# Patient Record
Sex: Female | Born: 1980 | Race: Black or African American | Hispanic: No | Marital: Married | Smoking: Never smoker
Health system: Southern US, Community
[De-identification: ages and names within clinical notes are randomized; demographics above are authoritative.]

## PROBLEM LIST (undated history)

## (undated) DIAGNOSIS — D649 Anemia, unspecified: Secondary | ICD-10-CM

## (undated) HISTORY — PX: ANKLE SURGERY: SHX546

---

## 2016-12-03 ENCOUNTER — Encounter (HOSPITAL_COMMUNITY): Payer: Self-pay | Admitting: Emergency Medicine

## 2016-12-03 ENCOUNTER — Emergency Department (HOSPITAL_COMMUNITY)
Admission: EM | Admit: 2016-12-03 | Discharge: 2016-12-03 | Disposition: A | Discharge status: Home / Self Care | Payer: 59 | Attending: Emergency Medicine | Admitting: Emergency Medicine

## 2016-12-03 ENCOUNTER — Emergency Department (HOSPITAL_COMMUNITY): Payer: 59

## 2016-12-03 DIAGNOSIS — S39012A Strain of muscle, fascia and tendon of lower back, initial encounter: Secondary | ICD-10-CM

## 2016-12-03 DIAGNOSIS — M545 Low back pain, unspecified: Secondary | ICD-10-CM

## 2016-12-03 LAB — POC URINE PREG, ED: PREG TEST UR: NEGATIVE

## 2016-12-03 MED ORDER — CYCLOBENZAPRINE HCL 10 MG PO TABS: 10.0000 mg | ORAL_TABLET | Freq: Two times a day (BID) | ORAL | 0 refills | Status: DC | PRN

## 2016-12-03 MED ORDER — IBUPROFEN 200 MG PO TABS
600.0000 mg | ORAL_TABLET | Freq: Once | ORAL | Status: AC
  Administered 2016-12-03: 600 mg via ORAL
  Filled 2016-12-03: qty 3

## 2016-12-03 MED ORDER — CYCLOBENZAPRINE HCL 10 MG PO TABS
10.0000 mg | ORAL_TABLET | Freq: Once | ORAL | Status: AC
  Administered 2016-12-03: 10 mg via ORAL
  Filled 2016-12-03: qty 1

## 2016-12-03 MED ORDER — IBUPROFEN 600 MG PO TABS: 600.0000 mg | ORAL_TABLET | Freq: Four times a day (QID) | ORAL | 0 refills | Status: DC | PRN

## 2016-12-03 NOTE — Discharge Instructions (Signed)
It was my pleasure taking care of you today!   You have been seen in the Emergency Department today for back pain.   Flexeril is your muscle relaxer to take as needed. Ibuprofen as needed for pain. In addition to this, use ice and/or heat for additional pain relief.  Your back pain should get better over the next 2 weeks. Please follow up with your doctor this week for a recheck if still having symptoms.  COLD THERAPY DIRECTIONS:  Ice or gel packs can be used to reduce both pain and swelling. Ice is the most helpful within the first 24 to 48 hours after an injury or flareup from overusing a muscle or joint.  Ice is effective, has very few side effects, and is safe for most people to use.    Return to the ED for worsening back pain, fever, weakness or numbness of either leg, or if you develop either (1) an inability to urinate or have bowel movements, or (2) loss of your ability to control your bathroom functions (if you start having "accidents"), or if you develop other new symptoms that concern you.  To find a primary care or specialty doctor please call [phone] or [phone] to access "[facility] Doctor [facility]."  You may also go on the [facility] website at [email or web address]  There are also multiple [facility] practices throughout the [place] that are frequently accepting new patients. You may find a clinic that is close to your home and contact them.  [facility] [place] [address] [place] [address] [phone]  [facility] in [place] (also locations in [place] and [place]) - [place] [phone]  [place] [phone]

## 2016-12-03 NOTE — ED Provider Notes (Signed)
Laura Holloway DEPT Provider Note   CSN: [id number] Arrival date & time: 12/03/16  1030     History   Chief Complaint Chief Complaint  Patient presents with  . Back Pain  . Neck Pain    HPI Laura Holloway is a 36 y.o. female.  The history is provided by the patient and medical records. No language interpreter was used.  Back Pain   Pertinent negatives include no fever, no numbness, no abdominal pain, no dysuria and no weakness.  Neck Pain   Pertinent negatives include no numbness and no weakness.   Laura Holloway is an otherwise healthy  36 y.o. female who presents to the Emergency Department complaining of constant, aching mid-to-left low back pain which began this morning. No radiation of pain down the legs or upper back. Patient states that she has a history of low back pain. She notes seeing several doctors in the urgent care / ED setting which have told her that it was because of her weight. She just saw a PCP in the last 2-3 weeks but was not having acute flare at that time. She was given a referral to orthopedics, but has not heard from anyone about the appointment. Patient states that she woke up this morning and the left low back was spasming. She did not take any medications prior to arrival for symptoms and notes no alleviating factors. Pain is worse with bending over, twisting or walking. Patient denies upper back or neck pain. No fever, saddle anesthesia, weakness, numbness, urinary complaints including retention/incontinence. No history of cancer, IVDU, or recent spinal procedures.  History reviewed. No pertinent past medical history.  There are no active problems to display for this patient.   Past Surgical History:  Procedure Laterality Date  . ANKLE SURGERY      OB History    No data available       Home Medications    Prior to Admission medications   Medication Sig Start Date End Date Taking? Authorizing Provider  cyclobenzaprine (FLEXERIL) 10 MG tablet  Take 1 tablet (10 mg total) by mouth 2 (two) times daily as needed for muscle spasms. 12/03/16   Laura Holloway, Laura Almond, PA-C  ibuprofen (ADVIL,MOTRIN) 600 MG tablet Take 1 tablet (600 mg total) by mouth every 6 (six) hours as needed. 12/03/16   Laura Holloway, Laura Almond, PA-C    Family History No family history on file.  Social History Social History  Substance Use Topics  . Smoking status: Never Smoker  . Smokeless tobacco: Never Used  . Alcohol use Yes     Comment: occasional      Allergies   Patient has no known allergies.   Review of Systems Review of Systems  Constitutional: Negative for fever.  Gastrointestinal: Negative for abdominal pain, constipation, diarrhea, nausea and vomiting.  Genitourinary: Negative for difficulty urinating and dysuria.  Musculoskeletal: Positive for back pain and neck pain.  Neurological: Negative for weakness and numbness.     Physical Exam Updated Vital Signs BP (!) 153/72 (BP Location: Right Arm)   Pulse 81   Temp 98.3 F (36.8 C) (Oral)   Resp 16   LMP 11/19/2016 Comment: Negative urine Pregnancy test 12/03/2016  SpO2 99%   Physical Exam  Constitutional: She is oriented to person, place, and time. She appears well-developed and well-nourished.  Neck:  No midline or paraspinal tenderness. Full ROM without pain.  Cardiovascular: Normal rate, regular rhythm, normal heart sounds and intact distal pulses.   Pulmonary/Chest: Effort normal and  breath sounds normal. No respiratory distress.  Abdominal: Soft. Bowel sounds are normal. She exhibits no distension. There is no tenderness.  Musculoskeletal:  Tenderness to palpation of left and middle lumbar spine. 5/5 muscle strength in bilateral LE's. Straight leg raises are negative bilaterally for radicular symptoms. Able to ambulate independently with steady gait.  Neurological: She is alert and oriented to person, place, and time. She has normal reflexes.  Bilateral lower extremities  neurovascularly intact.  Skin: Skin is warm and dry. No rash noted. No erythema.  Nursing note and vitals reviewed.    ED Treatments / Results  Labs (all labs ordered are listed, but only abnormal results are displayed) Labs Reviewed  POC URINE PREG, ED    EKG  EKG Interpretation None       Radiology Dg Lumbar Spine Complete  Result Date: 12/03/2016 CLINICAL DATA:  Back spasm. EXAM: LUMBAR SPINE - COMPLETE 4+ VIEW COMPARISON:  None FINDINGS: There is no evidence of lumbar spine fracture. Alignment is normal. Intervertebral disc spaces are maintained. IMPRESSION: Negative. Electronically Signed   By: Kerby Moors M.D.   On: 12/03/2016 13:59    Procedures Procedures (including critical care time)  Medications Ordered in ED Medications  cyclobenzaprine (FLEXERIL) tablet 10 mg (10 mg Oral Given 12/03/16 1304)  ibuprofen (ADVIL,MOTRIN) tablet 600 mg (600 mg Oral Given 12/03/16 1305)     Initial Impression / Assessment and Plan / ED Course  I have reviewed the triage vital signs and the nursing notes.  Pertinent labs & imaging results that were available during my care of the patient were reviewed by me and considered in my medical decision making (see chart for details).    Laura Holloway is a 36 y.o. female who presents to ED for low back pain.   Patient demonstrates no lower extremity weakness, saddle anesthesia, bowel or bladder incontinence or neuro deficits. No concern for cauda equina. No fevers or other infectious symptoms to suggest that the patient's back pain is due to an infection. Lower extremities are neurovascularly intact and patient is ambulating without difficulty. I have reviewed return precautions, including the development of any of these signs or symptoms and the patient has voiced understanding. I reviewed supportive care instructions including NSAIDs, early range of motion exercises and PCP follow-up if symptoms do not improve. Ortho referral given at  patient's request. RX for muscle relaxants. Patient voiced understanding and agreement with plan. All questions answered.   Final Clinical Impressions(s) / ED Diagnoses   Final diagnoses:  Bilateral low back pain without sciatica, unspecified chronicity  Strain of lumbar region, initial encounter    New Prescriptions Discharge Medication List as of 12/03/2016  2:35 PM    START taking these medications   Details  cyclobenzaprine (FLEXERIL) 10 MG tablet Take 1 tablet (10 mg total) by mouth 2 (two) times daily as needed for muscle spasms., Starting Mon 12/03/2016, Print    ibuprofen (ADVIL,MOTRIN) 600 MG tablet Take 1 tablet (600 mg total) by mouth every 6 (six) hours as needed., Starting Mon 12/03/2016, Print         Armstead Heiland, Laura Almond, PA-C 12/03/16 1635    Tanna Furry, MD 12/04/16 [phone]

## 2016-12-03 NOTE — ED Triage Notes (Signed)
Patient c/o back spasm and neck pain that started today after waking up. Patient reports that she has had back issues for many years. Denies any new lifting, injuries to cause pain.

## 2016-12-12 ENCOUNTER — Other Ambulatory Visit: Payer: Self-pay | Admitting: Sports Medicine

## 2016-12-12 DIAGNOSIS — M545 Low back pain, unspecified: Secondary | ICD-10-CM

## 2016-12-12 DIAGNOSIS — G8929 Other chronic pain: Secondary | ICD-10-CM

## 2016-12-19 ENCOUNTER — Ambulatory Visit
Admission: RE | Admit: 2016-12-19 | Discharge: 2016-12-19 | Disposition: A | Discharge status: Home / Self Care | Payer: 59 | Source: Ambulatory Visit | Attending: Sports Medicine | Admitting: Sports Medicine

## 2016-12-19 DIAGNOSIS — M545 Low back pain, unspecified: Secondary | ICD-10-CM

## 2016-12-19 DIAGNOSIS — G8929 Other chronic pain: Secondary | ICD-10-CM | POA: Diagnosis not present

## 2016-12-19 DIAGNOSIS — M47896 Other spondylosis, lumbar region: Secondary | ICD-10-CM | POA: Insufficient documentation

## 2017-05-27 NOTE — H&P (Signed)
Laura Holloway is a 37 y.o. female here for  [facility] and bilateral salpingectomy for Menorrhagia,S/P HerOption 01/30/17 . > 10 yr h/o of heavy menorrhagia . Failed mirena and Her Option ablation . She want definitive surgery . Pap , Embx done 10 / 18 = nl . U/s 12/18 2 fibroids one anterior 6 cm . Bleeds for 8-14 days    Past Medical History:  has a past medical history of Anemia, unspecified, Back pain, unspecified, and Uterine fibroid.  Past Surgical History:  has a past surgical history that includes HerOption (01/30/2017). Family History: family history includes Diabetes in her father and mother; High blood pressure (Hypertension) in her father; Prostate cancer in her maternal grandfather. Social History:  reports that she has never smoked. She has never used smokeless tobacco. She reports that she drinks alcohol. OB/GYN History:          OB History    Gravida  3   Para  2   Term  2   Preterm      AB  1   Living  2     SAB      TAB      Ectopic      Molar      Multiple      Live Births  2          Allergies: has No Known Allergies. Medications:  Current Outpatient Medications:  .  ferrous sulfate 325 (65 FE) MG tablet, Take 325 mg by mouth 2 (two) times daily with meals, Disp: , Rfl:  .  phentermine (ADIPEX-P) 37.5 mg tablet, Take 37.5 mg by mouth every morning before breakfast, Disp: , Rfl:   Review of Systems: General:                      No fatigue or weight loss Eyes:                           No vision changes Ears:                            No hearing difficulty Respiratory:                No cough or shortness of breath Pulmonary:                  No asthma or shortness of breath Cardiovascular:           No chest pain, palpitations, dyspnea on exertion Gastrointestinal:          No abdominal bloating, chronic diarrhea, constipations, masses, pain or hematochezia Genitourinary:             No hematuria, dysuria, abnormal vaginal discharge, pelvic  pain, ++Menometrorrhagia Lymphatic:                   No swollen lymph nodes Musculoskeletal:         No muscle weakness Neurologic:                  No extremity weakness, syncope, seizure disorder Psychiatric:                  No history of depression, delusions or suicidal/homicidal ideation    Exam:      Vitals:   05/10/17 0837  BP: 125/87  Pulse: 82    Body mass index is  45.46 kg/m.  WDWN black female in NAD   Lungs: CTA  CV : RRR without murmur   Breast: exam done in sitting and lying position : No dimpling or retraction, no dominant mass, no spontaneous discharge, no axillary adenopathy Neck:  no thyromegaly Abdomen: soft , no mass, normal active bowel sounds,  non-tender, no rebound tenderness Pelvic: tanner stage 5 ,  External genitalia: vulva /labia no lesions Urethra: no prolapse Vagina: normal physiologic d/c Cervix: no lesions, no cervical motion tenderness  , cervix high  Uterus: normal size shape and contour, non-tender, exam limited based on body habitus  Adnexa: no mass,  non-tender   Rectovaginal: Impression:   The primary encounter diagnosis was Menorrhagia with irregular cycle. Diagnoses of Menorrhagia with regular cycle and Uterine leiomyoma, unspecified location were also pertinent to this visit. Failed ablation and conservative treatment   Plan:   I have spoken with the patient regarding treatment options including expectant management, hormonal options, or surgical intervention. After a full discussion the pt elects to proceed with [facility] and Bilateral salpingectomy  Spoke to her about the rare risk of leiomyosarcoma  And upstaging the disease with morcellation .   Caroline Sauger, MD

## 2017-05-29 ENCOUNTER — Encounter
Admission: RE | Admit: 2017-05-29 | Discharge: 2017-05-29 | Disposition: A | Discharge status: Home / Self Care | Payer: 59 | Source: Ambulatory Visit | Attending: Obstetrics and Gynecology | Admitting: Obstetrics and Gynecology

## 2017-05-29 ENCOUNTER — Encounter: Payer: Self-pay | Admitting: *Deleted

## 2017-05-29 ENCOUNTER — Other Ambulatory Visit: Payer: Self-pay

## 2017-05-29 HISTORY — DX: Anemia, unspecified: D64.9

## 2017-05-29 NOTE — Patient Instructions (Addendum)
Your procedure is scheduled on: 06-10-17 MONDAY Report to Same Day Surgery 2nd floor medical mall [facility] Entrance-take elevator on left to 2nd floor.  Check in with surgery information desk.) To find out your arrival time please call [phone] between 1PM - 3PM on 06-07-17 FRIDAY  Remember: Instructions that are not followed completely may result in serious medical risk, up to and including death, or upon the discretion of your surgeon and anesthesiologist your surgery may need to be rescheduled.    _x___ 1. Do not eat food after midnight the night before your procedure.  NO GUM OR CANDY AFTER MIDNIGHT.  You may drink clear liquids up to 2 hours before you are scheduled to arrive at the hospital for your procedure.  Do not drink clear liquids within 2 hours of your scheduled arrival to the hospital.  Clear liquids include  --Water or Apple juice without pulp  --Clear carbohydrate beverage such as ClearFast or Gatorade  --Black Coffee or Clear Tea (No milk, no creamers, do not add anything to the coffee or Tea      __x__ 2. No Alcohol for 24 hours before or after surgery.   __x__3. No Smoking or e-cigarettes for 24 prior to surgery.  Do not use any chewable tobacco products for at least 6 hour prior to surgery   ____  4. Bring all medications with you on the day of surgery if instructed.    __x__ 5. Notify your doctor if there is any change in your medical condition     (cold, fever, infections).    x___6. On the morning of surgery brush your teeth with toothpaste and water.  You may rinse your mouth with mouth wash if you wish.  Do not swallow any toothpaste or mouthwash.   Do not wear jewelry, make-up, hairpins, clips or nail polish.  Do not wear lotions, powders, or perfumes. You may wear deodorant.  Do not shave 48 hours prior to surgery. Men may shave face and neck.  Do not bring valuables to the hospital.    [facility] is not responsible for any belongings or  valuables.               Contacts, dentures or bridgework may not be worn into surgery.  Leave your suitcase in the car. After surgery it may be brought to your room.  For patients admitted to the hospital, discharge time is determined by your treatment team.  _  Patients discharged the day of surgery will not be allowed to drive home.  You will need someone to drive you home and stay with you the night of your procedure.    Please read over the following fact sheets that you were given:   [facility] Preparing for Surgery and or MRSA Information   ____ Take anti-hypertensive listed below, cardiac, seizure, asthma,anti-reflux and psychiatric medicines. These include:  1. NONE  2.  3.  4.  5.  6.  X____Fleets enema as directed-DO FLEET ENEMA AT HOME DAY OF SURGERY 1 HOUR PRIOR TO ARRIVAL TIME TO HOSPITAL  _x___ Use CHG Soap or sage wipes as directed on instruction sheet   ____ Use inhalers on the day of surgery and bring to hospital day of surgery  ____ Stop Metformin and Janumet 2 days prior to surgery.    ____ Take 1/2 of usual insulin dose the night before surgery and none on the morning surgery.   ____ Follow recommendations from Cardiologist, Pulmonologist or PCP regarding  stopping Aspirin, Coumadin, Plavix ,Eliquis, Effient, or Pradaxa, and Pletal.  X____Stop Anti-inflammatories such as Advil, Aleve, Ibuprofen, Motrin, Naproxen, Naprosyn, Goodies powders or aspirin products 7 DAYS PRIOR TO SURGERY-OK to take Tylenol    ____ Stop supplements until after surgery.     ____ Bring C-Pap to the hospital.

## 2017-06-04 ENCOUNTER — Encounter
Admission: RE | Admit: 2017-06-04 | Discharge: 2017-06-04 | Disposition: A | Discharge status: Home / Self Care | Payer: 59 | Source: Ambulatory Visit | Attending: Obstetrics and Gynecology | Admitting: Obstetrics and Gynecology

## 2017-06-04 DIAGNOSIS — D259 Leiomyoma of uterus, unspecified: Secondary | ICD-10-CM | POA: Insufficient documentation

## 2017-06-04 DIAGNOSIS — Z01811 Encounter for preprocedural respiratory examination: Secondary | ICD-10-CM | POA: Diagnosis present

## 2017-06-04 DIAGNOSIS — N921 Excessive and frequent menstruation with irregular cycle: Secondary | ICD-10-CM | POA: Diagnosis not present

## 2017-06-04 DIAGNOSIS — Z01812 Encounter for preprocedural laboratory examination: Secondary | ICD-10-CM | POA: Diagnosis present

## 2017-06-04 LAB — BASIC METABOLIC PANEL
ANION GAP: 6 (ref 5–15)
BUN: 12 mg/dL (ref 6–20)
CHLORIDE: 105 mmol/L (ref 101–111)
CO2: 26 mmol/L (ref 22–32)
Calcium: 8.5 mg/dL — ABNORMAL LOW (ref 8.9–10.3)
Creatinine, Ser: 0.65 mg/dL (ref 0.44–1.00)
Glucose, Bld: 98 mg/dL (ref 65–99)
POTASSIUM: 3.5 mmol/L (ref 3.5–5.1)
SODIUM: 137 mmol/L (ref 135–145)

## 2017-06-04 LAB — CBC
HCT: 24.8 % — ABNORMAL LOW (ref 35.0–47.0)
Hemoglobin: 7.5 g/dL — ABNORMAL LOW (ref 12.0–16.0)
MCH: 18.7 pg — ABNORMAL LOW (ref 26.0–34.0)
MCHC: 30.4 g/dL — ABNORMAL LOW (ref 32.0–36.0)
MCV: 61.5 fL — AB (ref 80.0–100.0)
PLATELETS: 276 10*3/uL (ref 150–440)
RBC: 4.03 MIL/uL (ref 3.80–5.20)
RDW: 19.2 % — AB (ref 11.5–14.5)
WBC: 4.2 10*3/uL (ref 3.6–11.0)

## 2017-06-06 ENCOUNTER — Other Ambulatory Visit: Payer: Self-pay | Admitting: Obstetrics and Gynecology

## 2017-06-06 ENCOUNTER — Encounter
Admission: RE | Admit: 2017-06-06 | Discharge: 2017-06-06 | Disposition: A | Discharge status: Home / Self Care | Payer: 59 | Source: Ambulatory Visit | Attending: Obstetrics and Gynecology | Admitting: Obstetrics and Gynecology

## 2017-06-06 DIAGNOSIS — Z01812 Encounter for preprocedural laboratory examination: Secondary | ICD-10-CM | POA: Diagnosis not present

## 2017-06-06 LAB — ABO/RH: ABO/RH(D): A POS

## 2017-06-06 LAB — PREPARE RBC (CROSSMATCH)

## 2017-06-06 NOTE — Pre-Procedure Instructions (Signed)
PT SIGNED BLOOD CONSENT IN DR SCHERMERHORN'S OFFICE ON 05-28-17 STATING SHE WOULD RECEIVE BLOOD IF NEEDED.  WHEN I DID PTS PREOP PHONE INTERVIEW ON 05-31-17 SHE STATES THAT SHE DID NOT WANT BLOOD PRODUCTS SAYING THAT SHE WAS NOT A [facility] WITNESS BUT THAT SHE DID BELIEVE WHAT THEY BELIEVED- WHEN PT CAME IN FOR LABS ON 06-04-17 I SHOWED HER THE BLOOD CONSENT SHE SIGNED IN DR SCHERMERHORMS OFFICE AND SHE SAID AGAIN THAT SHE DID NOT WANT ANY BLOOD.  I GOT PT TO SIGN BLOOD REFUSAL FORM WHICH SHE DID AND SHE SAID ABSOLUTELY NO BLOOD PRODUCTS.    LATER THAT DAY ON 06-04-17 HER LABS CAME BACK AND HER HGB WAS 7.5.  LABS WERE FAXED TO DR SCHERMERHORNS OFFICE AND HE WANTED PT TO RECEIVE BLOOD.  INFORMED OFFICE THAT SHE SAID SHE DID NOT WANT ANY BLOOD PRODUCTS.  ANGELA AT DR SCHERMERHORN'S OFFICE SAID THAT DR S. SAID HE WOULD NOT DO HER HYSTERECTOMY IF SHE DID NOT GET A BLOOD TRANSFUSION AND SHE WOULD HAVE TO GO TO ANOTHER FACILITY FOR HER HYSTERECTOMY.  PT HAS NOW RECONSIDERED AND CAME IN TO PAT TODAY FOR HER T&S AND HER ABO-RH.  I GOT PT TO RESIGN AND DATE HER PREVIOUS BLOOD CONSENT THAT WAS ORIGINALLY SIGNED IN DR SCHERMERHORNS OFFICE STATING THAT SHE IS CONSENTING TO RECEIVE THIS BLOOD ON 06-07-17.  TOOK BLOOD BAND AND BLOOD CONSENT OVER TO SDS AND GAVE TO DEBBIE SLEMENDA RN TO PUT ON PTS CHART.

## 2017-06-07 ENCOUNTER — Ambulatory Visit
Admission: RE | Admit: 2017-06-07 | Discharge: 2017-06-07 | Disposition: A | Discharge status: Home / Self Care | Payer: 59 | Source: Ambulatory Visit | Attending: Obstetrics and Gynecology | Admitting: Obstetrics and Gynecology

## 2017-06-07 DIAGNOSIS — Z01812 Encounter for preprocedural laboratory examination: Secondary | ICD-10-CM | POA: Diagnosis not present

## 2017-06-07 MED ORDER — SODIUM CHLORIDE 0.9 % IV SOLN: Freq: Once | INTRAVENOUS | Status: DC

## 2017-06-07 MED ORDER — ACETAMINOPHEN 500 MG PO TABS
1000.0000 mg | ORAL_TABLET | Freq: Once | ORAL | Status: AC
  Administered 2017-06-07: 1000 mg via ORAL

## 2017-06-07 MED ORDER — ACETAMINOPHEN 500 MG PO TABS
ORAL_TABLET | ORAL | Status: AC
  Filled 2017-06-07: qty 2

## 2017-06-08 LAB — TYPE AND SCREEN
ABO/RH(D): A POS
ANTIBODY SCREEN: NEGATIVE
UNIT DIVISION: 0
UNIT DIVISION: 0

## 2017-06-08 LAB — BPAM RBC
BLOOD PRODUCT EXPIRATION DATE: 201904302359
Blood Product Expiration Date: 201904302359
ISSUE DATE / TIME: 201904120921
ISSUE DATE / TIME: 201904121148
UNIT TYPE AND RH: 6200
UNIT TYPE AND RH: 6200

## 2017-06-10 ENCOUNTER — Other Ambulatory Visit: Payer: Self-pay

## 2017-06-10 ENCOUNTER — Encounter: Payer: Self-pay | Admitting: *Deleted

## 2017-06-10 ENCOUNTER — Encounter
Admission: RE | Disposition: A | Discharge status: Home / Self Care | Payer: Self-pay | Source: Ambulatory Visit | Attending: Obstetrics and Gynecology

## 2017-06-10 ENCOUNTER — Observation Stay
Admission: RE | Admit: 2017-06-10 | Discharge: 2017-06-11 | Disposition: A | Discharge status: Home / Self Care | Payer: 59 | Source: Ambulatory Visit | Attending: Obstetrics and Gynecology | Admitting: Obstetrics and Gynecology

## 2017-06-10 ENCOUNTER — Ambulatory Visit: Payer: 59 | Admitting: Certified Registered Nurse Anesthetist

## 2017-06-10 DIAGNOSIS — Z6841 Body Mass Index (BMI) 40.0 and over, adult: Secondary | ICD-10-CM | POA: Diagnosis not present

## 2017-06-10 DIAGNOSIS — D259 Leiomyoma of uterus, unspecified: Secondary | ICD-10-CM | POA: Insufficient documentation

## 2017-06-10 DIAGNOSIS — N800 Endometriosis of uterus: Secondary | ICD-10-CM | POA: Insufficient documentation

## 2017-06-10 DIAGNOSIS — N920 Excessive and frequent menstruation with regular cycle: Secondary | ICD-10-CM | POA: Diagnosis present

## 2017-06-10 DIAGNOSIS — D649 Anemia, unspecified: Secondary | ICD-10-CM | POA: Diagnosis not present

## 2017-06-10 DIAGNOSIS — Z9889 Other specified postprocedural states: Secondary | ICD-10-CM

## 2017-06-10 DIAGNOSIS — N838 Other noninflammatory disorders of ovary, fallopian tube and broad ligament: Secondary | ICD-10-CM | POA: Diagnosis not present

## 2017-06-10 HISTORY — PX: LAPAROSCOPIC BILATERAL SALPINGECTOMY: SHX5889

## 2017-06-10 HISTORY — PX: LAPAROSCOPIC SUPRACERVICAL HYSTERECTOMY: SHX5399

## 2017-06-10 LAB — CBC
HEMATOCRIT: 30.6 % — AB (ref 35.0–47.0)
HEMATOCRIT: 30.2 % — AB (ref 35.0–47.0)
HEMOGLOBIN: 9.5 g/dL — AB (ref 12.0–16.0)
Hemoglobin: 9.6 g/dL — ABNORMAL LOW (ref 12.0–16.0)
MCH: 20.4 pg — ABNORMAL LOW (ref 26.0–34.0)
MCH: 20.3 pg — AB (ref 26.0–34.0)
MCHC: 31.4 g/dL — AB (ref 32.0–36.0)
MCHC: 31.4 g/dL — AB (ref 32.0–36.0)
MCV: 65.1 fL — ABNORMAL LOW (ref 80.0–100.0)
MCV: 64.5 fL — ABNORMAL LOW (ref 80.0–100.0)
Platelets: 353 10*3/uL (ref 150–440)
Platelets: 368 10*3/uL (ref 150–440)
RBC: 4.7 MIL/uL (ref 3.80–5.20)
RBC: 4.68 MIL/uL (ref 3.80–5.20)
RDW: 23.2 % — AB (ref 11.5–14.5)
RDW: 23.3 % — ABNORMAL HIGH (ref 11.5–14.5)
WBC: 7 10*3/uL (ref 3.6–11.0)
WBC: 13.8 10*3/uL — ABNORMAL HIGH (ref 3.6–11.0)

## 2017-06-10 LAB — BASIC METABOLIC PANEL
Anion gap: 7 (ref 5–15)
BUN: 8 mg/dL (ref 6–20)
CO2: 25 mmol/L (ref 22–32)
CREATININE: 0.57 mg/dL (ref 0.44–1.00)
Calcium: 8.4 mg/dL — ABNORMAL LOW (ref 8.9–10.3)
Chloride: 104 mmol/L (ref 101–111)
GFR calc Af Amer: >60 mL/min (ref >60)
GFR calc non Af Amer: >60 mL/min (ref >60)
Glucose, Bld: 132 mg/dL — ABNORMAL HIGH (ref 65–99)
Potassium: 3.7 mmol/L (ref 3.5–5.1)
SODIUM: 136 mmol/L (ref 135–145)

## 2017-06-10 LAB — TYPE AND SCREEN
ABO/RH(D): A POS
ANTIBODY SCREEN: NEGATIVE

## 2017-06-10 LAB — POCT PREGNANCY, URINE: PREG TEST UR: NEGATIVE

## 2017-06-10 SURGERY — HYSTERECTOMY, SUPRACERVICAL, LAPAROSCOPIC
Anesthesia: General

## 2017-06-10 MED ORDER — DEXTROSE 5 % IV SOLN
3.0000 g | Freq: Once | INTRAVENOUS | Status: AC
  Administered 2017-06-10: 3 g via INTRAVENOUS
  Filled 2017-06-10: qty 3

## 2017-06-10 MED ORDER — SUGAMMADEX SODIUM 500 MG/5ML IV SOLN
INTRAVENOUS | Status: DC | PRN
  Administered 2017-06-10: 400 mg via INTRAVENOUS

## 2017-06-10 MED ORDER — MORPHINE SULFATE (PF) 2 MG/ML IV SOLN
1.0000 mg | INTRAVENOUS | Status: DC | PRN
  Administered 2017-06-10 (×2): 2 mg via INTRAVENOUS
  Filled 2017-06-10 (×2): qty 1

## 2017-06-10 MED ORDER — PROPOFOL 10 MG/ML IV BOLUS
INTRAVENOUS | Status: DC | PRN
  Administered 2017-06-10: 270 mg via INTRAVENOUS

## 2017-06-10 MED ORDER — FENTANYL CITRATE (PF) 100 MCG/2ML IJ SOLN
INTRAMUSCULAR | Status: DC | PRN
  Administered 2017-06-10 (×2): 50 ug via INTRAVENOUS

## 2017-06-10 MED ORDER — PROPOFOL 10 MG/ML IV BOLUS
INTRAVENOUS | Status: AC
  Filled 2017-06-10: qty 20

## 2017-06-10 MED ORDER — ONDANSETRON HCL 4 MG/2ML IJ SOLN: 4.0000 mg | Freq: Four times a day (QID) | INTRAMUSCULAR | Status: DC | PRN

## 2017-06-10 MED ORDER — DEXAMETHASONE SODIUM PHOSPHATE 10 MG/ML IJ SOLN
INTRAMUSCULAR | Status: AC
  Filled 2017-06-10: qty 1

## 2017-06-10 MED ORDER — FLEET ENEMA 7-19 GM/118ML RE ENEM
1.0000 | ENEMA | Freq: Once | RECTAL | Status: AC
  Administered 2017-06-10: 1 via RECTAL

## 2017-06-10 MED ORDER — ONDANSETRON HCL 4 MG/2ML IJ SOLN
INTRAMUSCULAR | Status: AC
  Filled 2017-06-10: qty 2

## 2017-06-10 MED ORDER — ONDANSETRON HCL 4 MG PO TABS: 4.0000 mg | ORAL_TABLET | Freq: Four times a day (QID) | ORAL | Status: DC | PRN

## 2017-06-10 MED ORDER — LACTATED RINGERS IV SOLN: INTRAVENOUS | Status: DC

## 2017-06-10 MED ORDER — FAMOTIDINE 20 MG PO TABS
20.0000 mg | ORAL_TABLET | Freq: Once | ORAL | Status: AC
  Administered 2017-06-10: 20 mg via ORAL

## 2017-06-10 MED ORDER — ACETAMINOPHEN 10 MG/ML IV SOLN
INTRAVENOUS | Status: DC | PRN
  Administered 2017-06-10: 1000 mg via INTRAVENOUS

## 2017-06-10 MED ORDER — OXYCODONE-ACETAMINOPHEN 5-325 MG PO TABS
1.0000 | ORAL_TABLET | ORAL | Status: DC | PRN
  Administered 2017-06-10 – 2017-06-11 (×3): 2 via ORAL
  Filled 2017-06-10 (×3): qty 2

## 2017-06-10 MED ORDER — LACTATED RINGERS IV SOLN
INTRAVENOUS | Status: DC
  Administered 2017-06-10 (×2): via INTRAVENOUS

## 2017-06-10 MED ORDER — FAMOTIDINE 20 MG PO TABS
ORAL_TABLET | ORAL | Status: AC
  Filled 2017-06-10: qty 1

## 2017-06-10 MED ORDER — LACTATED RINGERS IV SOLN
INTRAVENOUS | Status: DC
  Administered 2017-06-11: 03:00:00 via INTRAVENOUS

## 2017-06-10 MED ORDER — FENTANYL CITRATE (PF) 100 MCG/2ML IJ SOLN
INTRAMUSCULAR | Status: AC
  Administered 2017-06-10: 50 ug via INTRAVENOUS
  Filled 2017-06-10: qty 2

## 2017-06-10 MED ORDER — PHENYLEPHRINE HCL 10 MG/ML IJ SOLN
INTRAMUSCULAR | Status: DC | PRN
  Administered 2017-06-10 (×2): 100 ug via INTRAVENOUS
  Administered 2017-06-10: 200 ug via INTRAVENOUS

## 2017-06-10 MED ORDER — BUPIVACAINE HCL 0.5 % IJ SOLN
INTRAMUSCULAR | Status: DC | PRN
  Administered 2017-06-10: 16 mL

## 2017-06-10 MED ORDER — FENTANYL CITRATE (PF) 100 MCG/2ML IJ SOLN
INTRAMUSCULAR | Status: AC
  Filled 2017-06-10: qty 2

## 2017-06-10 MED ORDER — KETOROLAC TROMETHAMINE 30 MG/ML IJ SOLN
INTRAMUSCULAR | Status: AC
  Filled 2017-06-10: qty 1

## 2017-06-10 MED ORDER — ROCURONIUM BROMIDE 100 MG/10ML IV SOLN
INTRAVENOUS | Status: DC | PRN
  Administered 2017-06-10: 45 mg via INTRAVENOUS
  Administered 2017-06-10: 20 mg via INTRAVENOUS
  Administered 2017-06-10: 5 mg via INTRAVENOUS
  Administered 2017-06-10: 20 mg via INTRAVENOUS

## 2017-06-10 MED ORDER — PROMETHAZINE HCL 25 MG/ML IJ SOLN: 6.2500 mg | INTRAMUSCULAR | Status: DC | PRN

## 2017-06-10 MED ORDER — MIDAZOLAM HCL 2 MG/2ML IJ SOLN
INTRAMUSCULAR | Status: DC | PRN
  Administered 2017-06-10: 2 mg via INTRAVENOUS

## 2017-06-10 MED ORDER — LIDOCAINE HCL (CARDIAC) 20 MG/ML IV SOLN
INTRAVENOUS | Status: DC | PRN
  Administered 2017-06-10: 80 mg via INTRAVENOUS

## 2017-06-10 MED ORDER — EPHEDRINE SULFATE 50 MG/ML IJ SOLN
INTRAMUSCULAR | Status: DC | PRN
  Administered 2017-06-10 (×2): 10 mg via INTRAVENOUS

## 2017-06-10 MED ORDER — ACETAMINOPHEN 10 MG/ML IV SOLN
INTRAVENOUS | Status: AC
  Filled 2017-06-10: qty 100

## 2017-06-10 MED ORDER — DEXAMETHASONE SODIUM PHOSPHATE 10 MG/ML IJ SOLN
INTRAMUSCULAR | Status: DC | PRN
  Administered 2017-06-10: 10 mg via INTRAVENOUS

## 2017-06-10 MED ORDER — SUCCINYLCHOLINE CHLORIDE 20 MG/ML IJ SOLN
INTRAMUSCULAR | Status: AC
  Filled 2017-06-10: qty 1

## 2017-06-10 MED ORDER — KETOROLAC TROMETHAMINE 30 MG/ML IJ SOLN
INTRAMUSCULAR | Status: DC | PRN
  Administered 2017-06-10: 30 mg via INTRAVENOUS

## 2017-06-10 MED ORDER — ROCURONIUM BROMIDE 50 MG/5ML IV SOLN
INTRAVENOUS | Status: AC
  Filled 2017-06-10: qty 1

## 2017-06-10 MED ORDER — BUPIVACAINE HCL (PF) 0.5 % IJ SOLN
INTRAMUSCULAR | Status: AC
  Filled 2017-06-10: qty 30

## 2017-06-10 MED ORDER — ONDANSETRON HCL 4 MG/2ML IJ SOLN
INTRAMUSCULAR | Status: DC | PRN
  Administered 2017-06-10: 4 mg via INTRAVENOUS

## 2017-06-10 MED ORDER — SUCCINYLCHOLINE CHLORIDE 20 MG/ML IJ SOLN
INTRAMUSCULAR | Status: DC | PRN
  Administered 2017-06-10: 200 mg via INTRAVENOUS

## 2017-06-10 MED ORDER — SILVER NITRATE-POT NITRATE 75-25 % EX MISC
CUTANEOUS | Status: AC
  Filled 2017-06-10: qty 1

## 2017-06-10 MED ORDER — MIDAZOLAM HCL 2 MG/2ML IJ SOLN
INTRAMUSCULAR | Status: AC
  Filled 2017-06-10: qty 2

## 2017-06-10 MED ORDER — LIDOCAINE HCL (PF) 2 % IJ SOLN
INTRAMUSCULAR | Status: AC
  Filled 2017-06-10: qty 10

## 2017-06-10 MED ORDER — FENTANYL CITRATE (PF) 100 MCG/2ML IJ SOLN
25.0000 ug | INTRAMUSCULAR | Status: DC | PRN
  Administered 2017-06-10: 25 ug via INTRAVENOUS
  Administered 2017-06-10: 50 ug via INTRAVENOUS

## 2017-06-10 SURGICAL SUPPLY — 48 items
BAG URINE DRAINAGE (UROLOGICAL SUPPLIES) ×4 IMPLANT
BLADE SURG SZ11 CARB STEEL (BLADE) ×4 IMPLANT
CANISTER SUCT 1200ML W/VALVE (MISCELLANEOUS) ×4 IMPLANT
CATH FOLEY 2WAY  5CC 16FR (CATHETERS) ×2
CATH URTH 16FR FL 2W BLN LF (CATHETERS) ×2 IMPLANT
CHLORAPREP W/TINT 26ML (MISCELLANEOUS) ×8 IMPLANT
CLOSURE WOUND 1/2 X4 (GAUZE/BANDAGES/DRESSINGS) ×1
DRSG TEGADERM 2-3/8X2-3/4 SM (GAUZE/BANDAGES/DRESSINGS) ×12 IMPLANT
GLOVE BIO SURGEON STRL SZ 6.5 (GLOVE) ×3 IMPLANT
GLOVE BIO SURGEON STRL SZ8 (GLOVE) ×32 IMPLANT
GLOVE BIO SURGEONS STRL SZ 6.5 (GLOVE) ×1
GLOVE INDICATOR 6.5 STRL GRN (GLOVE) ×4 IMPLANT
GOWN STRL REUS W/ TWL LRG LVL3 (GOWN DISPOSABLE) ×6 IMPLANT
GOWN STRL REUS W/ TWL XL LVL3 (GOWN DISPOSABLE) ×2 IMPLANT
GOWN STRL REUS W/TWL LRG LVL3 (GOWN DISPOSABLE) ×6
GOWN STRL REUS W/TWL XL LVL3 (GOWN DISPOSABLE) ×2
GRASPER SUT TROCAR 14GX15 (MISCELLANEOUS) ×4 IMPLANT
IRRIGATION STRYKERFLOW (MISCELLANEOUS) ×2 IMPLANT
IRRIGATOR STRYKERFLOW (MISCELLANEOUS) ×4
IV LACTATED RINGERS 1000ML (IV SOLUTION) ×4 IMPLANT
KIT PINK PAD W/HEAD ARE REST (MISCELLANEOUS) ×4
KIT PINK PAD W/HEAD ARM REST (MISCELLANEOUS) ×2 IMPLANT
KIT TURNOVER CYSTO (KITS) ×4 IMPLANT
LABEL OR SOLS (LABEL) ×4 IMPLANT
MORCELLATOR XCISE  COR (MISCELLANEOUS) ×2
MORCELLATOR XCISE COR (MISCELLANEOUS) ×2 IMPLANT
NS IRRIG 500ML POUR BTL (IV SOLUTION) ×4 IMPLANT
PACK GYN LAPAROSCOPIC (MISCELLANEOUS) ×4 IMPLANT
PAD OB MATERNITY 4.3X12.25 (PERSONAL CARE ITEMS) ×4 IMPLANT
PAD PREP 24X41 OB/GYN DISP (PERSONAL CARE ITEMS) ×4 IMPLANT
SET CYSTO W/LG BORE CLAMP LF (SET/KITS/TRAYS/PACK) ×4 IMPLANT
SHEARS HARMONIC ACE PLUS 36CM (ENDOMECHANICALS) ×4 IMPLANT
SOLUTION ELECTROLUBE (MISCELLANEOUS) ×4 IMPLANT
SPONGE GAUZE 2X2 8PLY STER LF (GAUZE/BANDAGES/DRESSINGS) ×2
SPONGE GAUZE 2X2 8PLY STRL LF (GAUZE/BANDAGES/DRESSINGS) ×6 IMPLANT
STRIP CLOSURE SKIN 1/2X4 (GAUZE/BANDAGES/DRESSINGS) ×3 IMPLANT
SUT VIC AB 0 CT1 36 (SUTURE) ×4 IMPLANT
SUT VIC AB 2-0 UR6 27 (SUTURE) ×20 IMPLANT
SUT VIC AB 4-0 SH 27 (SUTURE) ×6
SUT VIC AB 4-0 SH 27XANBCTRL (SUTURE) ×6 IMPLANT
SYR 10ML LL (SYRINGE) ×4 IMPLANT
TROCAR 12M 150ML BLUNT (TROCAR) IMPLANT
TROCAR ENDO BLADELESS 11MM (ENDOMECHANICALS) ×4 IMPLANT
TROCAR XCEL BLUNT TIP 100MML (ENDOMECHANICALS) IMPLANT
TROCAR XCEL NON-BLD 5MMX100MML (ENDOMECHANICALS) IMPLANT
TROCAR XCEL UNIV SLVE 11M 100M (ENDOMECHANICALS) ×4 IMPLANT
TUBING INSUF HEATED (TUBING) ×4 IMPLANT
TUBING INSUFFLATION (TUBING) ×4 IMPLANT

## 2017-06-10 NOTE — Transfer of Care (Signed)
Immediate Anesthesia Transfer of Care Note  Patient: Laura Holloway  Procedure(s) Performed: LAPAROSCOPIC SUPRACERVICAL HYSTERECTOMY (N/A ) LAPAROSCOPIC BILATERAL SALPINGECTOMY (Bilateral )  Patient Location: PACU  Anesthesia Type:General  Level of Consciousness: awake  Airway & Oxygen Therapy: Patient Spontanous Breathing  Post-op Assessment: Report given to RN  Post vital signs: stable  Last Vitals:  Vitals Value Taken Time  BP 93/66 06/10/2017  1:46 PM  Temp    Pulse 77 06/10/2017  1:50 PM  Resp 10 06/10/2017  1:50 PM  SpO2 100 % 06/10/2017  1:50 PM  Vitals shown include unvalidated device data.  Last Pain:  Vitals:   06/10/17 1345  TempSrc:   PainSc: (P) Asleep         Complications: No apparent anesthesia complications

## 2017-06-10 NOTE — Anesthesia Preprocedure Evaluation (Signed)
Anesthesia Evaluation  Patient identified by MRN, date of birth, ID band Patient awake    Reviewed: Allergy & Precautions, H&P , NPO status , Patient's Chart, lab work & pertinent test results, reviewed documented beta blocker date and time   History of Anesthesia Complications Negative for: history of anesthetic complications  Airway Mallampati: II  TM Distance: >3 FB Neck ROM: full    Dental  (+) Dental Advidsory Given   Pulmonary neg pulmonary ROS,           Cardiovascular Exercise Tolerance: Good negative cardio ROS       Neuro/Psych negative neurological ROS  negative psych ROS   GI/Hepatic negative GI ROS, Neg liver ROS,   Endo/Other  neg diabetesMorbid obesity  Renal/GU negative Renal ROS  negative genitourinary   Musculoskeletal   Abdominal   Peds  Hematology  (+) Blood dyscrasia, anemia ,   Anesthesia Other Findings Past Medical History: No date: Anemia   Reproductive/Obstetrics negative OB ROS                             Anesthesia Physical Anesthesia Plan  ASA: III  Anesthesia Plan: General   Post-op Pain Management:    Induction: Intravenous  PONV Risk Score and Plan: 3 and Ondansetron and Dexamethasone  Airway Management Planned: Oral ETT  Additional Equipment:   Intra-op Plan:   Post-operative Plan: Extubation in OR  Informed Consent: I have reviewed the patients History and Physical, chart, labs and discussed the procedure including the risks, benefits and alternatives for the proposed anesthesia with the patient or authorized representative who has indicated his/her understanding and acceptance.   Dental Advisory Given  Plan Discussed with: Anesthesiologist, CRNA and Surgeon  Anesthesia Plan Comments:         Anesthesia Quick Evaluation

## 2017-06-10 NOTE — Anesthesia Procedure Notes (Signed)
Procedure Name: Intubation Date/Time: 06/10/2017 10:24 AM Performed by: Carron Curie, CRNA Pre-anesthesia Checklist: Patient identified, Patient being monitored, Timeout performed, Emergency Drugs available and Suction available Patient Re-evaluated:Patient Re-evaluated prior to induction Oxygen Delivery Method: Circle system utilized Preoxygenation: Pre-oxygenation with 100% oxygen Induction Type: IV induction Ventilation: Mask ventilation without difficulty Laryngoscope Size: Mac and 3 Grade View: Grade II Tube type: Oral Tube size: 7.0 mm Number of attempts: 1 Airway Equipment and Method: Stylet Placement Confirmation: ETT inserted through vocal cords under direct vision,  positive ETCO2 and breath sounds checked- equal and bilateral Secured at: 21 cm Tube secured with: Tape Dental Injury: Teeth and Oropharynx as per pre-operative assessment

## 2017-06-10 NOTE — Brief Op Note (Signed)
06/10/2017  1:12 PM  PATIENT:  Laura Holloway  37 y.o. female  PRE-OPERATIVE DIAGNOSIS:  Menorrhagia,  Fibroid  POST-OPERATIVE DIAGNOSIS:  Menorrhagia,  Fibroid  PROCEDURE:  Procedure(s): LAPAROSCOPIC SUPRACERVICAL HYSTERECTOMY (N/A) LAPAROSCOPIC BILATERAL SALPINGECTOMY (Bilateral)  SURGEON:  Surgeon(s) and Role:    * Schermerhorn, Gwen Her, MD - Primary    * Ward, Honor Loh, MD - Assisting  PHYSICIAN ASSISTANT: Pa student Alfonse Spruce  ASSISTANTS: none   ANESTHESIA:   general  EBL:  150 mL   BLOOD ADMINISTERED:none  DRAINS: Urinary Catheter (Foley)   LOCAL MEDICATIONS USED:  MARCAINE     SPECIMEN:  Source of Specimen:  uterus  bilateral fallopian tubes   DISPOSITION OF SPECIMEN:  PATHOLOGY  COUNTS:  YES  TOURNIQUET:  * No tourniquets in log *  DICTATION: .Other Dictation: Dictation Number verbal  PLAN OF CARE: Admit for overnight observation  PATIENT DISPOSITION:  PACU - hemodynamically stable.   Delay start of Pharmacological VTE agent (>24hrs) due to surgical blood loss or risk of bleeding: not applicable

## 2017-06-10 NOTE — Progress Notes (Signed)
Pt is ready for [facility] . + bilateral salpingectomy . No c/o  Repeat HCT 30% after her 2 unit blood transfusion .  NPO. proceed

## 2017-06-10 NOTE — Anesthesia Post-op Follow-up Note (Signed)
Anesthesia QCDR form completed.        

## 2017-06-10 NOTE — Progress Notes (Signed)
Patient ID: Laura Holloway, female   DOB: 01/23/1981, 37 y.o.   MRN: [id number] DOS , pain in adequate control  Good urine output  Clears po good . IS adequate , VS stable  Will d/c in am

## 2017-06-11 ENCOUNTER — Encounter: Payer: Self-pay | Admitting: Obstetrics and Gynecology

## 2017-06-11 DIAGNOSIS — N920 Excessive and frequent menstruation with regular cycle: Secondary | ICD-10-CM | POA: Diagnosis not present

## 2017-06-11 LAB — SURGICAL PATHOLOGY

## 2017-06-11 MED ORDER — IBUPROFEN 800 MG PO TABS: 800.0000 mg | ORAL_TABLET | Freq: Three times a day (TID) | ORAL | 0 refills | Status: AC | PRN

## 2017-06-11 MED ORDER — DOCUSATE SODIUM 100 MG PO CAPS: 100.0000 mg | ORAL_CAPSULE | Freq: Every day | ORAL | 2 refills | Status: AC | PRN

## 2017-06-11 MED ORDER — OXYCODONE-ACETAMINOPHEN 5-325 MG PO TABS: 1.0000 | ORAL_TABLET | ORAL | 0 refills | Status: AC | PRN

## 2017-06-11 MED ORDER — ONDANSETRON HCL 4 MG PO TABS: 4.0000 mg | ORAL_TABLET | Freq: Four times a day (QID) | ORAL | 0 refills | Status: AC | PRN

## 2017-06-11 NOTE — Discharge Summary (Signed)
Physician Discharge Summary  Patient ID: Laura Holloway MRN: [id number] DOB/AGE: [birth year] 37 y.o.  Admit date: 06/10/2017 Discharge date: 06/11/2017  Admission Diagnoses:symtomatic fibroid utx , anemia  Discharge Diagnoses: same Active Problems:   Postoperative state   Discharged Condition: good  Hospital Course: underwent a LSH and bilateral salpingectomy   Consults: None  Significant Diagnostic Studies: labs:  Results for orders placed or performed during the hospital encounter of 06/10/17 (from the past 24 hour(s))  CBC     Status: Abnormal   Collection Time: 06/10/17  5:58 PM  Result Value Ref Range   WBC 13.8 (H) 3.6 - 11.0 K/uL   RBC 4.68 3.80 - 5.20 MIL/uL   Hemoglobin 9.5 (L) 12.0 - 16.0 g/dL   HCT 30.2 (L) 35.0 - 47.0 %   MCV 64.5 (L) 80.0 - 100.0 fL   MCH 20.3 (L) 26.0 - 34.0 pg   MCHC 31.4 (L) 32.0 - 36.0 g/dL   RDW 23.3 (H) 11.5 - 14.5 %   Platelets 368 150 - 440 K/uL  Basic metabolic panel     Status: Abnormal   Collection Time: 06/10/17  5:58 PM  Result Value Ref Range   Sodium 136 135 - 145 mmol/L   Potassium 3.7 3.5 - 5.1 mmol/L   Chloride 104 101 - 111 mmol/L   CO2 25 22 - 32 mmol/L   Glucose, Bld 132 (H) 65 - 99 mg/dL   BUN 8 6 - 20 mg/dL   Creatinine, Ser 0.57 0.44 - 1.00 mg/dL   Calcium 8.4 (L) 8.9 - 10.3 mg/dL   GFR calc non Af Amer >60 >60 mL/min   GFR calc Af Amer >60 >60 mL/min   Anion gap 7 5 - 15    Treatments: surgery: LSh and bilateral salpingectomy  Discharge Exam: Blood pressure (!) 104/58, pulse 79, temperature 98.5 F (36.9 C), temperature source Oral, resp. rate 18, height 5\' 8"  (1.727 m), weight 135.6 kg (299 lb), last menstrual period 06/03/2017, SpO2 100 %. General appearance: alert and cooperative Cardio: regular rate and rhythm, S1, S2 normal, no murmur, click, rub or gallop GI: soft, non-tender; bowel sounds normal; no masses,  no organomegaly Incision/Wound:C/D/I  Disposition: Discharge disposition: 01-Home or Self  Care       Discharge Instructions    Call MD for:  difficulty breathing, headache or visual disturbances   Complete by:  As directed    Call MD for:  extreme fatigue   Complete by:  As directed    Call MD for:  hives   Complete by:  As directed    Call MD for:  persistant dizziness or light-headedness   Complete by:  As directed    Call MD for:  persistant nausea and vomiting   Complete by:  As directed    Call MD for:  redness, tenderness, or signs of infection (pain, swelling, redness, odor or green/yellow discharge around incision site)   Complete by:  As directed    Call MD for:  severe uncontrolled pain   Complete by:  As directed    Call MD for:  temperature >100.4   Complete by:  As directed    Diet - low sodium heart healthy   Complete by:  As directed    Increase activity slowly   Complete by:  As directed      Allergies as of 06/11/2017   No Known Allergies     Medication List    STOP taking these medications   ADULT  GUMMY PO   cyclobenzaprine 10 MG tablet Commonly known as:  FLEXERIL   ferrous sulfate 325 (65 FE) MG EC tablet     TAKE these medications   docusate sodium 100 MG capsule Commonly known as:  COLACE Take 1 capsule (100 mg total) by mouth daily as needed.   ibuprofen 800 MG tablet Commonly known as:  ADVIL,MOTRIN Take 1 tablet (800 mg total) by mouth every 8 (eight) hours as needed. What changed:    medication strength  how much to take  when to take this   ondansetron 4 MG tablet Commonly known as:  ZOFRAN Take 1 tablet (4 mg total) by mouth every 6 (six) hours as needed for nausea.   oxyCODONE-acetaminophen 5-325 MG tablet Commonly known as:  PERCOCET/ROXICET Take 1-2 tablets by mouth every 4 (four) hours as needed for moderate pain ((when tolerating fluids)).      Follow-up Information    Arneda Sappington, Gwen Her, MD Follow up in 2 week(s).   Specialty:  Obstetrics and Gynecology Why:  post op Contact information: [address] [place] [address] [phone]           Signed: Gwen Her Laura Holloway 06/11/2017, 9:19 AM

## 2017-06-11 NOTE — Progress Notes (Signed)
Patient doing well this morning. Vitals stable and WDL. Patient tolerating PO fluids and regular diet well. All three incision sites: minimal serosanguineous bleeding. Incision dressings changed. Bowel sounds active in all four quadrants. Patient passing flatus. Incentive spirometer being used. Pain steady around 5-6/10. Pain controlled with oral pain med. Catheter out by night shift RN at 0530. Patient up to void at 0845. Patient ambulating well. Significant other asking for work excuse. RN explained that it would have patient's name on it, as it prints from her chart. Patient states she is fine with this.Work excuse given to significant other.  Discharge instructions given. Prescriptions sent to pharmacy by provider. Patient and significant other verbalize understanding of teaching. Patient discharged home via wheelchair at 1145.

## 2017-06-11 NOTE — Op Note (Signed)
NAMEERAN, WINDISH                ACCOUNT NO.:  [id number]  MEDICAL RECORD NO.:  [id number]  LOCATION:                                 FACILITY:  PHYSICIAN:  Laverta Baltimore, MD     DATE OF BIRTH:  DATE OF PROCEDURE:  06/10/2017 DATE OF DISCHARGE:                              OPERATIVE REPORT   PREOPERATIVE DIAGNOSIS: 1. Menorrhagia. 2. Failed endometrial ablation. 3. Fibroids. 4. Anemia.  POSTOPERATIVE DIAGNOSIS: 1. Menorrhagia. 2. Failed endometrial ablation. 3. Fibroids. 4. Anemia.  PROCEDURE PERFORMED: 1. Laparoscopic supracervical hysterectomy. 2. Bilateral salpingectomy.  SURGEON:  Laverta Baltimore, MD  ANESTHESIA:  General endotracheal anesthesia.  FIRST ASSISTANT:  Ward.  SECOND ASSISTANT:  PA student, Alfonse Spruce.  INDICATION:  A 37 year old, gravida 3, para 2 patient with significant menorrhagia and significant anemia, failing an endometrial ablation in the past.  The patient has also failed Mirena use to control her bleeding.  The patient is noted to have at least a 6 cm fibroid on ultrasound.  DESCRIPTION OF PROCEDURE:  After adequate general endotracheal anesthesia, the patient was placed in dorsal supine position.  Legs in the [place].  The patient's abdomen, perineum, and vagina were prepped and draped in normal sterile fashion.  A time-out was performed. The patient received 3 g IV Ancef for prophylaxis prior to commencement of the case.  A 20 mm infraumbilical incision was made.  Initial attempt was to place the laparoscope with the Optiview cannula, which was unsuccessful.  Sharp dissection was then used to open the fascia and an 11 mm Optiview gun was then used to place the 10 mm scope into the abdominal cavity.  The patient's abdomen was insufflated with carbon dioxide.  A second port site was placed 3 cm medial to the left anterior iliac spine, #10 mm, port was advanced under direct visualization and a third port site was placed to  the right lower quadrant 3 cm medial to the right anterior iliac spine, again #10 mm port was advanced under direct visualization.  Initial impression revealed a large lobulated uterus with a large pedunculated fibroid coming off the right fundus approximately 6 cm.  Harmonic scalpel was brought up to the operative field and the fimbriated end of the left fallopian tube was grasped and elevated and the left fallopian tube was dissected to the cornual base. The round ligament on the left was then cauterized and opened with the Harmonic Scalpel, followed by dissection of the utero-ovarian ligament and sequential incisions through the broad ligament.  Bladder flap was created and the left uterine artery was skeletonized, cauterized with the Kleppinger's, and then transected with the Harmonic scalpel. Similar procedure was repeated on the patient's right side.  After opening the round ligament, the broad ligament was opened and continuation of the bladder flap was created and the right uterine artery was skeletonized, cauterized, and transected.  The right fallopian tube ultimately was removed at the end of the case given the difficulty with visualization due to this large fibroid uterus.  The cervix was transected at the level of the uterosacral ligaments.  The previously placed uterine sound which was attached to the single-tooth tenaculum in the  anterior cervix was removed and the rest of the cervix was transected.  The endocervical canal was then cauterized with the Kleppinger's and good hemostasis was noted.  The morcellator was brought up to the operative field and inserted through the left lower port site. The uterus was then morcellated in standard fashion.  No significant remnants were left behind.  The patient's abdomen was copiously irrigated and suctioned.  The ureters appeared normal with normal peristaltic activity bilaterally.  Pressure was lowered to 7 mmHg and good hemostasis  was noted.  The upper abdomen appeared normal.  The 3 port sites were closed with 2 layers with a fascial layer with 2-0 Vicryl suture and all skin incisions were closed with interrupted 4-0 Vicryl suture.  Good cosmetic effect.  Single-tooth tenaculum was removed from the cervix.  Silver nitrate was used to control small amount of oozing.  The Foley catheter which was placed at the commencement of the case will be left in.  There was 1800 mL of urine output.  The patient did receive Toradol at the end of the case, 30 mg intravenously.  ESTIMATED BLOOD LOSS:  150 mL.  INTRAOPERATIVE FLUIDS:  1800 mL.  The patient was taken to recovery room in good condition.    ______________________________ Laverta Baltimore, MD   ______________________________ Laverta Baltimore, MD    TS/MEDQ  D:  06/10/2017  T:  06/10/2017  Job:  [id number]

## 2017-06-11 NOTE — Anesthesia Postprocedure Evaluation (Signed)
Anesthesia Post Note  Patient: Laura Holloway  Procedure(s) Performed: LAPAROSCOPIC SUPRACERVICAL HYSTERECTOMY (N/A ) LAPAROSCOPIC BILATERAL SALPINGECTOMY (Bilateral )  Patient location during evaluation: PACU Anesthesia Type: General Level of consciousness: awake and alert Pain management: pain level controlled Vital Signs Assessment: post-procedure vital signs reviewed and stable Respiratory status: spontaneous breathing, nonlabored ventilation, respiratory function stable and patient connected to nasal cannula oxygen Cardiovascular status: blood pressure returned to baseline and stable Postop Assessment: no apparent nausea or vomiting Anesthetic complications: no     Last Vitals:  Vitals:   06/11/17 0421 06/11/17 0732  BP: 110/63 (!) 104/58  Pulse: 85 79  Resp: 18 18  Temp: 36.7 C 36.9 C  SpO2: 97% 100%    Last Pain:  Vitals:   06/11/17 0921  TempSrc:   PainSc: [place]

## 2018-06-01 IMAGING — MR MR LUMBAR SPINE W/O CM
4 of 5 series · 29 of 48 positions shown · non-contrast
Comparison: Lumbar spine radiographs 12/03/2016

CLINICAL DATA: Chronic low back pain with bilateral buttock pain.

EXAM:
MRI LUMBAR SPINE WITHOUT CONTRAST
TECHNIQUE: Multiplanar, multisequence MR imaging of the lumbar spine was
performed. No intravenous contrast was administered.

[Series 2: T2 · sagittal · 4.0mm · 0.81mm/px · 6 of 15 slices shown (1 of 2)]
[im 1/15]
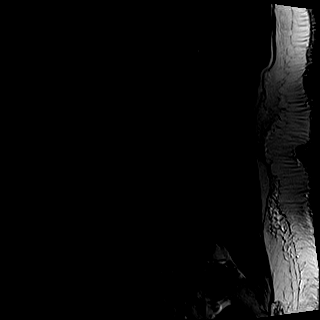
[im 3/15]
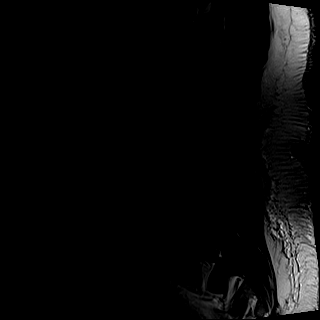
[im 6/15]
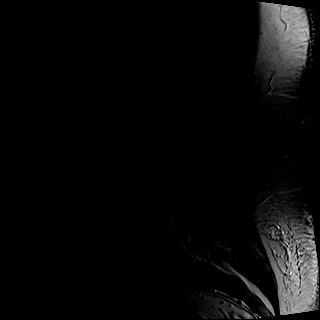
[im 9/15]
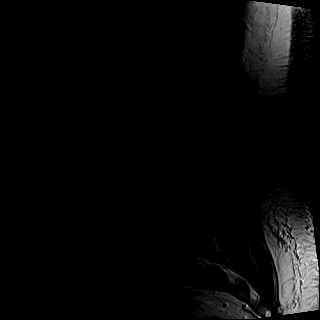
[im 12/15]
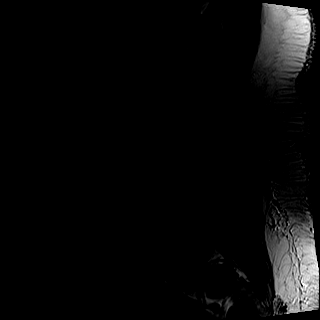
[im 15/15]
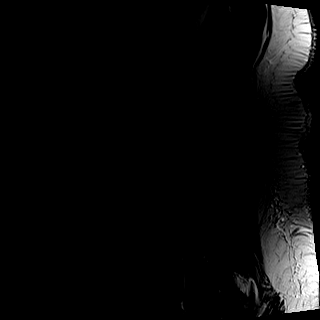

[Series 4: STIR · sagittal · 4.0mm · 0.51mm/px · 5 of 15 slices shown]
[im 1/15]
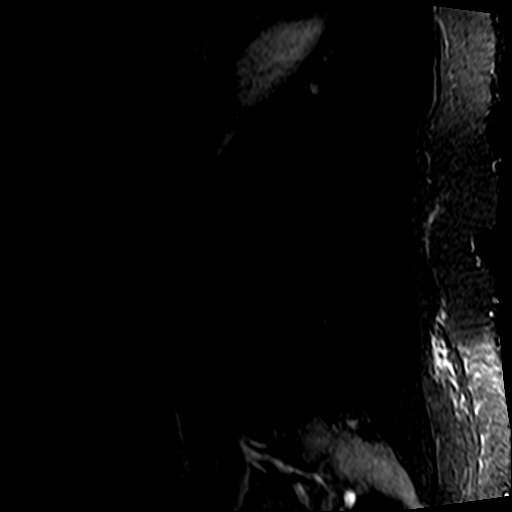
[im 3/15]
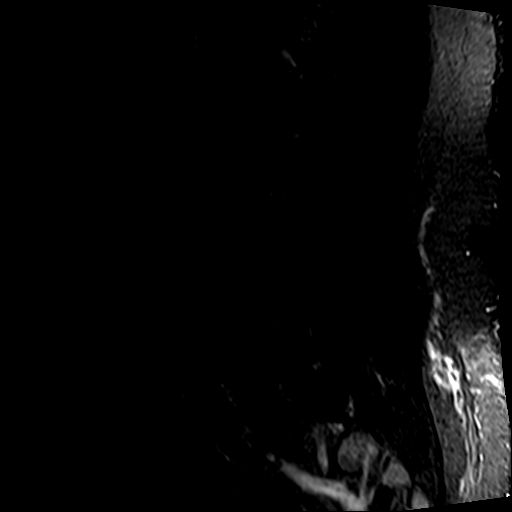
[im 6/15]
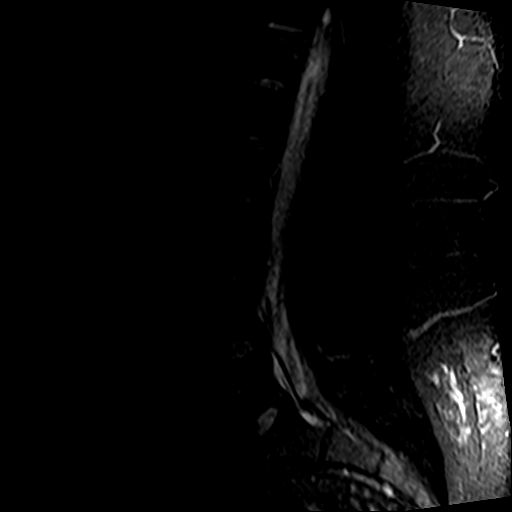
[im 9/15]
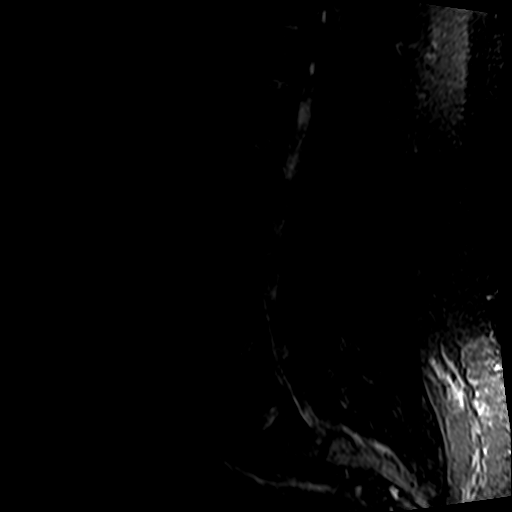
[im 15/15]
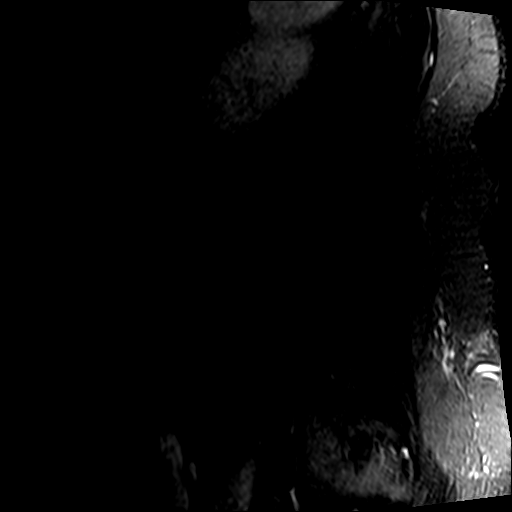

[Series 5: T2 · axial · 4.0mm · 0.78mm/px · z∈[-79,+124]mm · 9 of 37 slices shown (2 of 2)]
[im 1/37]
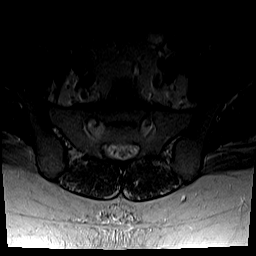
[im 6/37]
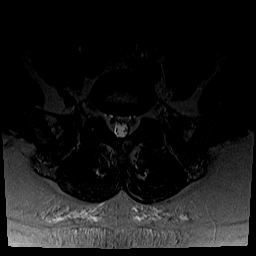
[im 11/37]
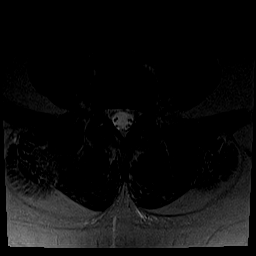
[im 16/37]
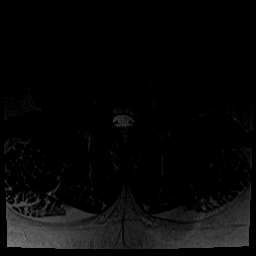
[im 19/37]
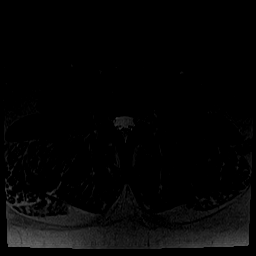
[im 21/37]
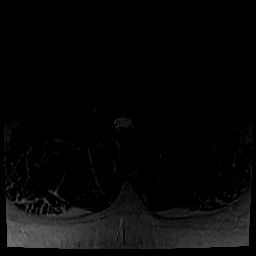
[im 26/37]
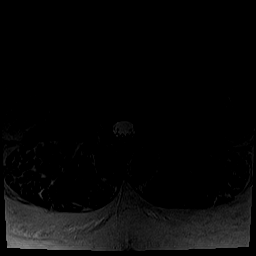
[im 31/37]
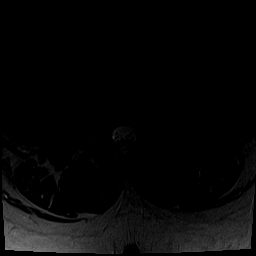
[im 37/37]
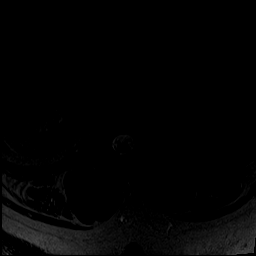

[Series 6: T1 · axial · 4.0mm · 0.39mm/px · z∈[-79,+124]mm · 9 of 37 slices shown]
[im 1/37]
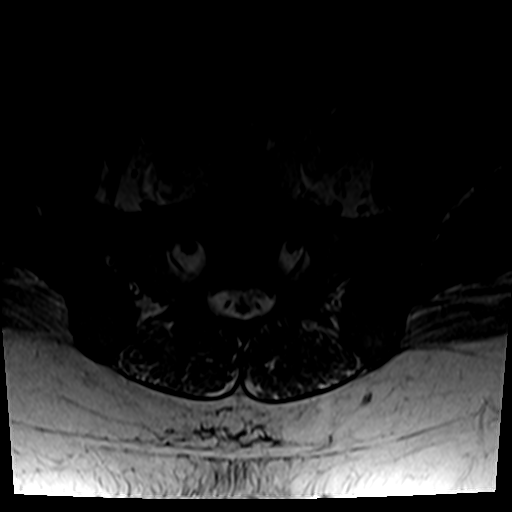
[im 6/37]
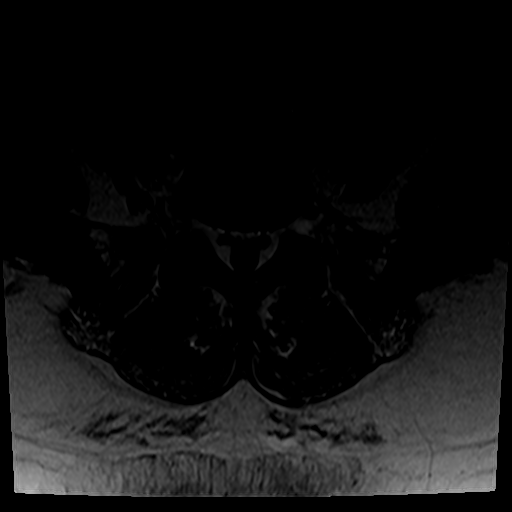
[im 11/37]
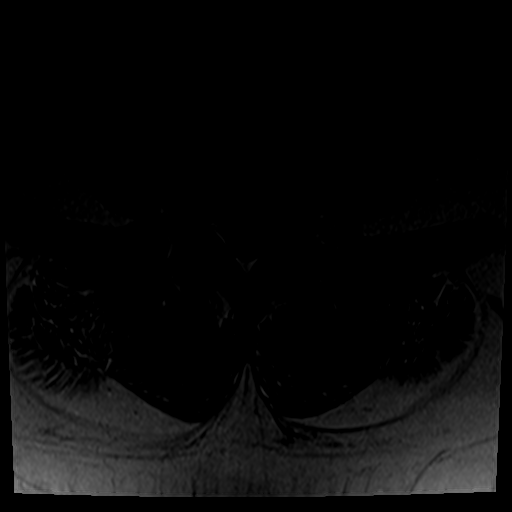
[im 16/37]
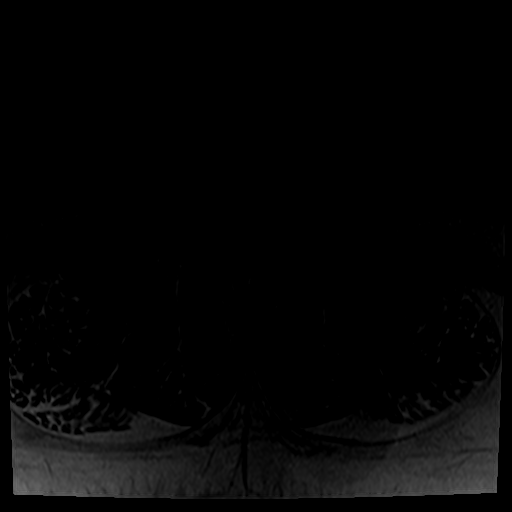
[im 19/37]
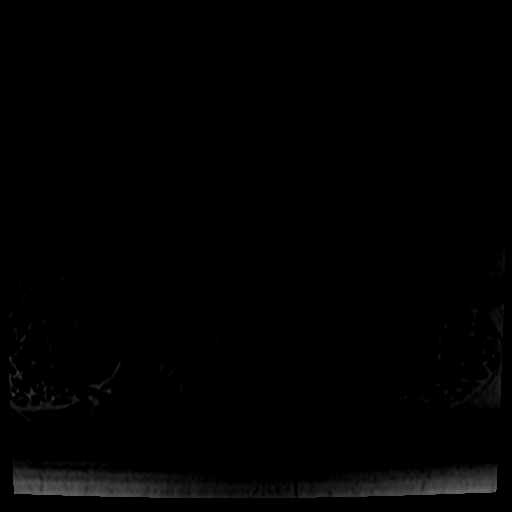
[im 21/37]
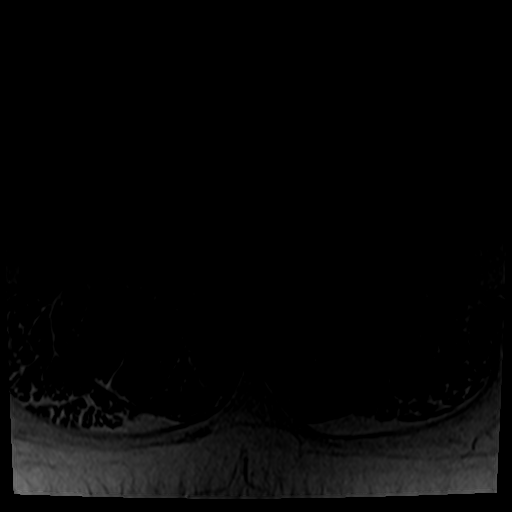
[im 26/37]
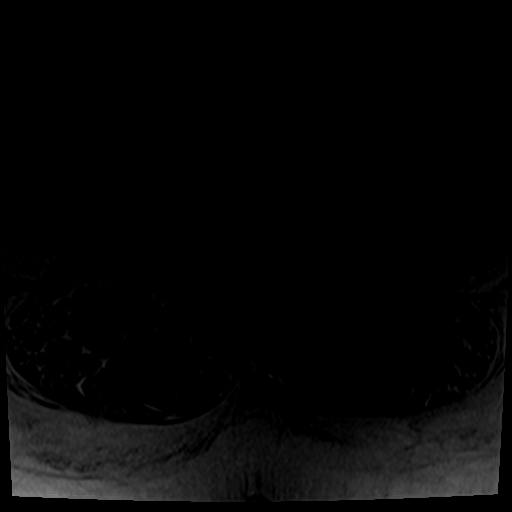
[im 31/37]
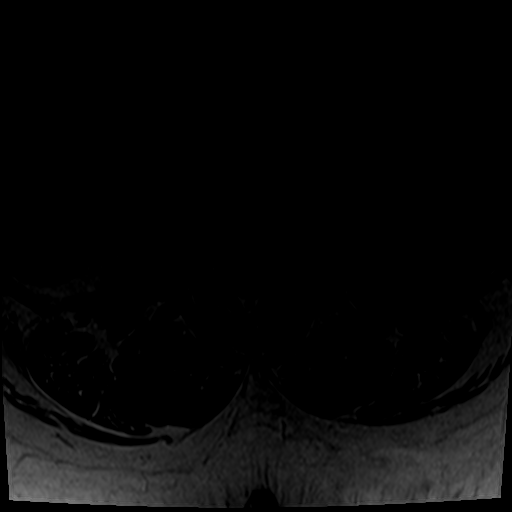
[im 37/37]
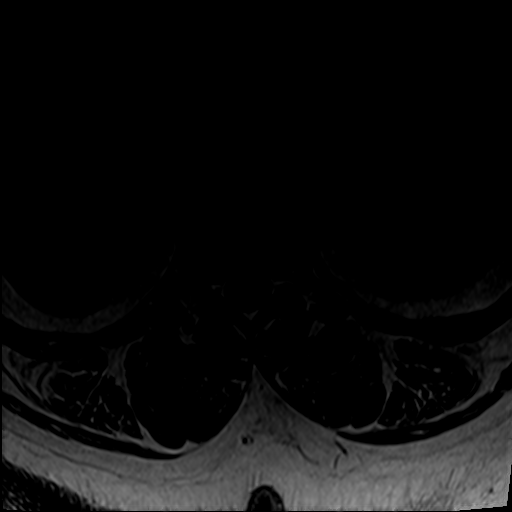

[29 of 48 positions shown; findings below may reference images not displayed]

FINDINGS: Segmentation:  Standard.

Alignment:  Trace retrolisthesis of L5 on S1.

Vertebrae: Diffusely diminished bone marrow signal intensity which
is nonspecific but can be seen with anemia, smoking, and obesity. No
suspicious focal osseous lesion. The no evidence of fracture or
significant marrow edema.

Conus medullaris: Extends to the L1-2 level and appears normal.

Paraspinal and other soft tissues: Unremarkable.

Disc levels:

Disc height and hydration are preserved throughout the lumbar spine.
No disc herniation is identified. Facet arthrosis is mild at L3-4,
mild on the right and moderate on the left at L4-5, and moderate on
the right and mild on the left at L5-S1. The spinal canal and neural
foramina are widely patent throughout.
IMPRESSION: 1. Moderate lower lumbar facet arthrosis.
2. No disc herniation or stenosis.

## 2019-06-30 ENCOUNTER — Ambulatory Visit: Payer: 59 | Attending: Neurology

## 2019-06-30 DIAGNOSIS — R0683 Snoring: Secondary | ICD-10-CM | POA: Insufficient documentation

## 2019-07-01 ENCOUNTER — Other Ambulatory Visit: Payer: Self-pay
